# Patient Record
Sex: Female | Born: 2000 | Race: Black or African American | Hispanic: No | Marital: Single | Smoking: Never smoker
Health system: Southern US, Community
[De-identification: ages and names within clinical notes are randomized; demographics above are authoritative.]

---
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
## 2021-01-11 ENCOUNTER — Other Ambulatory Visit: Payer: Self-pay | Admitting: *Deleted

## 2021-01-11 DIAGNOSIS — N6320 Unspecified lump in the left breast, unspecified quadrant: Secondary | ICD-10-CM

## 2021-02-22 ENCOUNTER — Ambulatory Visit
Admission: RE | Admit: 2021-02-22 | Discharge: 2021-02-22 | Disposition: A | Discharge status: Home / Self Care | Payer: 59 | Source: Ambulatory Visit | Attending: *Deleted | Admitting: *Deleted

## 2021-02-22 ENCOUNTER — Other Ambulatory Visit: Payer: Self-pay

## 2021-02-22 DIAGNOSIS — N6320 Unspecified lump in the left breast, unspecified quadrant: Secondary | ICD-10-CM

## 2021-05-10 ENCOUNTER — Emergency Department (HOSPITAL_COMMUNITY)
Admission: EM | Admit: 2021-05-10 | Discharge: 2021-05-11 | Disposition: A | Discharge status: Home / Self Care | Payer: 59 | Attending: Emergency Medicine | Admitting: Emergency Medicine

## 2021-05-10 ENCOUNTER — Encounter (HOSPITAL_COMMUNITY): Payer: Self-pay

## 2021-05-10 ENCOUNTER — Emergency Department (HOSPITAL_COMMUNITY): Payer: 59

## 2021-05-10 ENCOUNTER — Other Ambulatory Visit: Payer: Self-pay

## 2021-05-10 DIAGNOSIS — Y92410 Unspecified street and highway as the place of occurrence of the external cause: Secondary | ICD-10-CM | POA: Insufficient documentation

## 2021-05-10 DIAGNOSIS — M542 Cervicalgia: Secondary | ICD-10-CM | POA: Diagnosis not present

## 2021-05-10 DIAGNOSIS — Z5321 Procedure and treatment not carried out due to patient leaving prior to being seen by health care provider: Secondary | ICD-10-CM | POA: Diagnosis not present

## 2021-05-10 DIAGNOSIS — M546 Pain in thoracic spine: Secondary | ICD-10-CM | POA: Insufficient documentation

## 2021-05-10 DIAGNOSIS — M545 Low back pain, unspecified: Secondary | ICD-10-CM | POA: Diagnosis present
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
## 2021-05-11 NOTE — ED Notes (Signed)
Patient left on own accord °

## 2021-11-20 ENCOUNTER — Emergency Department (HOSPITAL_COMMUNITY)
Admission: EM | Admit: 2021-11-20 | Discharge: 2021-11-21 | Disposition: A | Discharge status: Home / Self Care | Payer: 59 | Attending: Emergency Medicine | Admitting: Emergency Medicine

## 2021-11-20 ENCOUNTER — Other Ambulatory Visit: Payer: Self-pay

## 2021-11-20 ENCOUNTER — Encounter (HOSPITAL_COMMUNITY): Payer: Self-pay

## 2021-11-20 DIAGNOSIS — G510 Bell's palsy: Secondary | ICD-10-CM | POA: Insufficient documentation

## 2021-11-20 DIAGNOSIS — R29810 Facial weakness: Secondary | ICD-10-CM | POA: Diagnosis present

## 2021-11-20 LAB — COMPREHENSIVE METABOLIC PANEL
ALT: 15 U/L (ref 0–44)
AST: 18 U/L (ref 15–41)
Albumin: 3.6 g/dL (ref 3.5–5.0)
Alkaline Phosphatase: 21 U/L — ABNORMAL LOW (ref 38–126)
Anion gap: 11 (ref 5–15)
BUN: 12 mg/dL (ref 6–20)
CO2: 23 mmol/L (ref 22–32)
Calcium: 9.3 mg/dL (ref 8.9–10.3)
Chloride: 104 mmol/L (ref 98–111)
Creatinine, Ser: 0.8 mg/dL (ref 0.44–1.00)
GFR, Estimated: >60 mL/min (ref >60)
Glucose, Bld: 88 mg/dL (ref 70–99)
Potassium: 4.1 mmol/L (ref 3.5–5.1)
Sodium: 138 mmol/L (ref 135–145)
Total Bilirubin: 1.1 mg/dL (ref 0.3–1.2)
Total Protein: 7.4 g/dL (ref 6.5–8.1)

## 2021-11-20 LAB — CBC
HCT: 36.5 % (ref 36.0–46.0)
Hemoglobin: 12.5 g/dL (ref 12.0–15.0)
MCH: 31.4 pg (ref 26.0–34.0)
MCHC: 34.2 g/dL (ref 30.0–36.0)
MCV: 91.7 fL (ref 80.0–100.0)
Platelets: 267 10*3/uL (ref 150–400)
RBC: 3.98 MIL/uL (ref 3.87–5.11)
RDW: 12.1 % (ref 11.5–15.5)
WBC: 4.6 10*3/uL (ref 4.0–10.5)
nRBC: 0 % (ref 0.0–0.2)

## 2021-11-20 LAB — C-REACTIVE PROTEIN: CRP: 0.5 mg/dL (ref <1.0)

## 2021-11-20 LAB — SEDIMENTATION RATE: Sed Rate: 6 mm/hr (ref 0–22)

## 2021-11-20 NOTE — ED Triage Notes (Signed)
Pt was taking pictures today and noticed that left side of moouth was not moving. Pt has no other deficits. No numbness or tingling.LSW at 2200 last night. Denies headache

## 2021-11-20 NOTE — ED Provider Triage Note (Signed)
Emergency Medicine Provider Triage Evaluation Note  Allison Herrera , a 21 y.o. female  was evaluated in triage.  Pt complains of left-sided facial weakness which began this morning.  She awakened with the complaint.  Her last known well was 10 PM last night.  The patient has no headache or other complaints.  She has obvious weakness on the left side of her face including weakness to the forehead muscles on the left side of her face.  Sensation is equal bilaterally.  Review of Systems  Positive: Left-sided facial paralysis/weakness Negative: Headache  Physical Exam  BP (!) 142/86 (BP Location: Right Arm)   Pulse 76   Temp 99.2 F (37.3 C) (Oral)   Resp 18   Ht 5\' 3"  (1.6 m)   Wt 77.1 kg   LMP 11/05/2021   SpO2 99%   BMI 30.11 kg/m  Gen:   Awake, no distress   Resp:  Normal effort  MSK:   Moves extremities without difficulty  Other:    Medical Decision Making  Medically screening exam initiated at 8:46 PM.  Appropriate orders placed.  Allison Herrera was informed that the remainder of the evaluation will be completed by another provider, this initial triage assessment does not replace that evaluation, and the importance of remaining in the ED until their evaluation is complete.  Presentation at this time consistent with Bell's palsy, no code stroke at this time   Allison Herrera 11/20/21 2047

## 2021-11-21 IMAGING — US US BREAST*L* LIMITED INC AXILLA
1 series · 6 of 6 positions shown · non-contrast
Comparison: Previous exam(s).
COMPARISON: Previous exam(s).

Addendum:
CLINICAL DATA: Patient presents for evaluation of LEFT breast mass.
Patient had ultrasound-guided core biopsy of mass in the 8 o'clock
location of the LEFT breast performed 08/15/2018 showing benign
concordant fibroadenoma. The patient feels the mass has gotten
larger.

EXAM:
ULTRASOUND OF THE LEFT BREAST

[Series 1: us breast*left* limited inc axilla · 0.07mm/px · 6 of 6 slices shown]
[im 1/6]
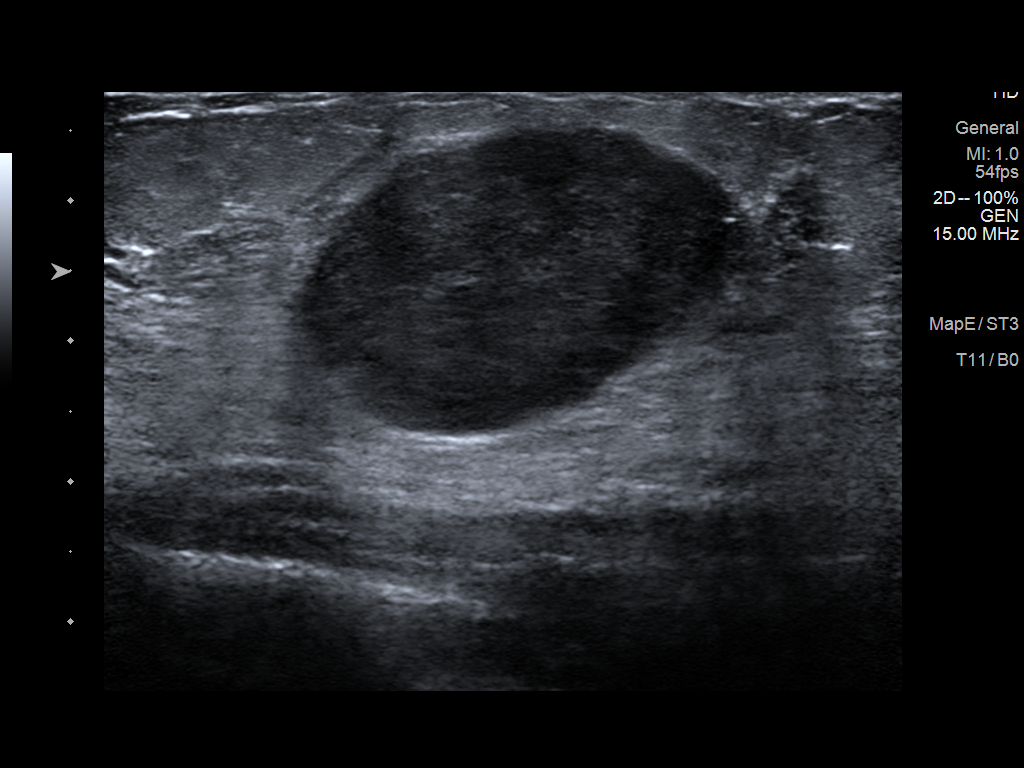
[im 2/6]
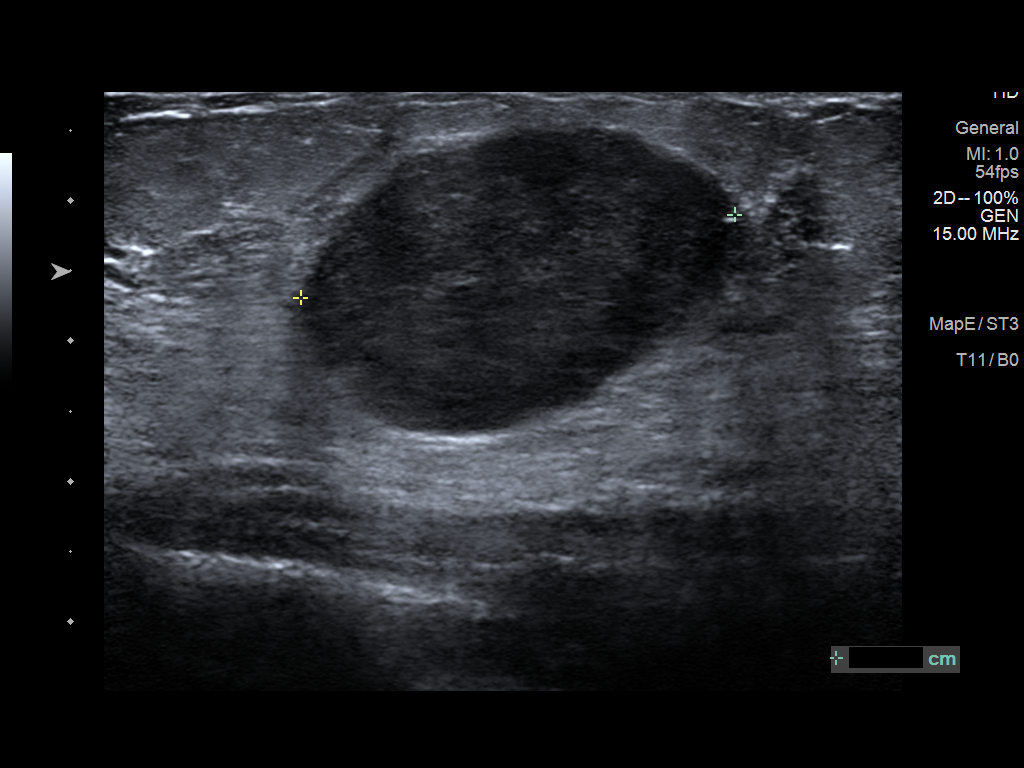
[im 3/6]
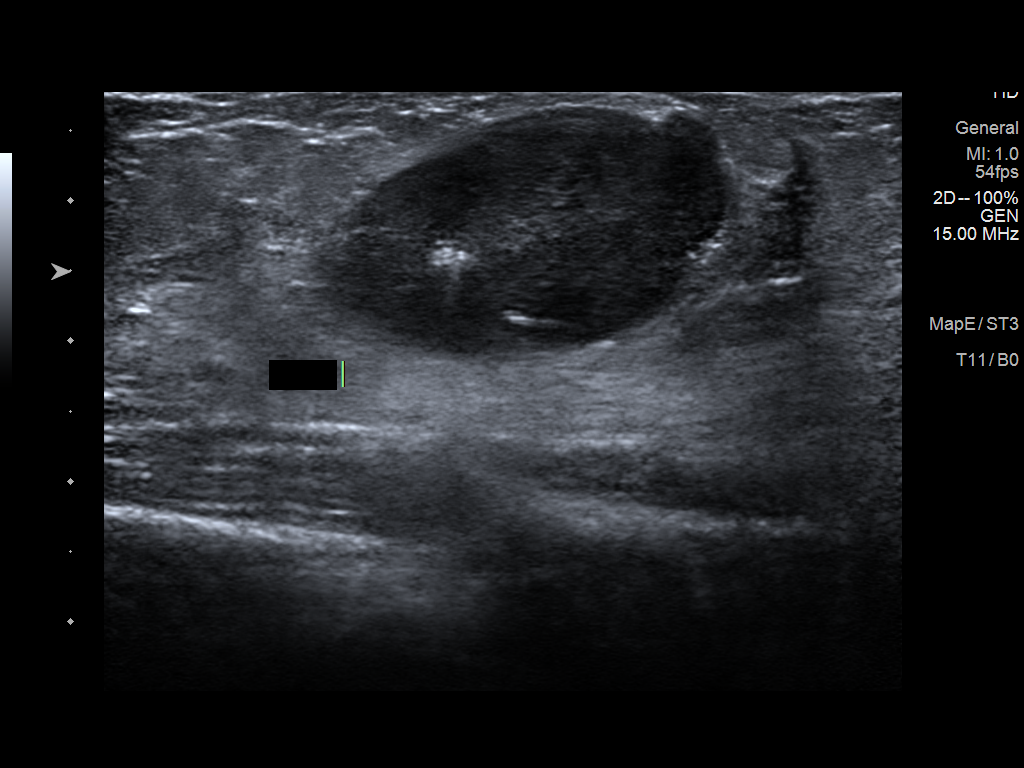
[im 4/6]
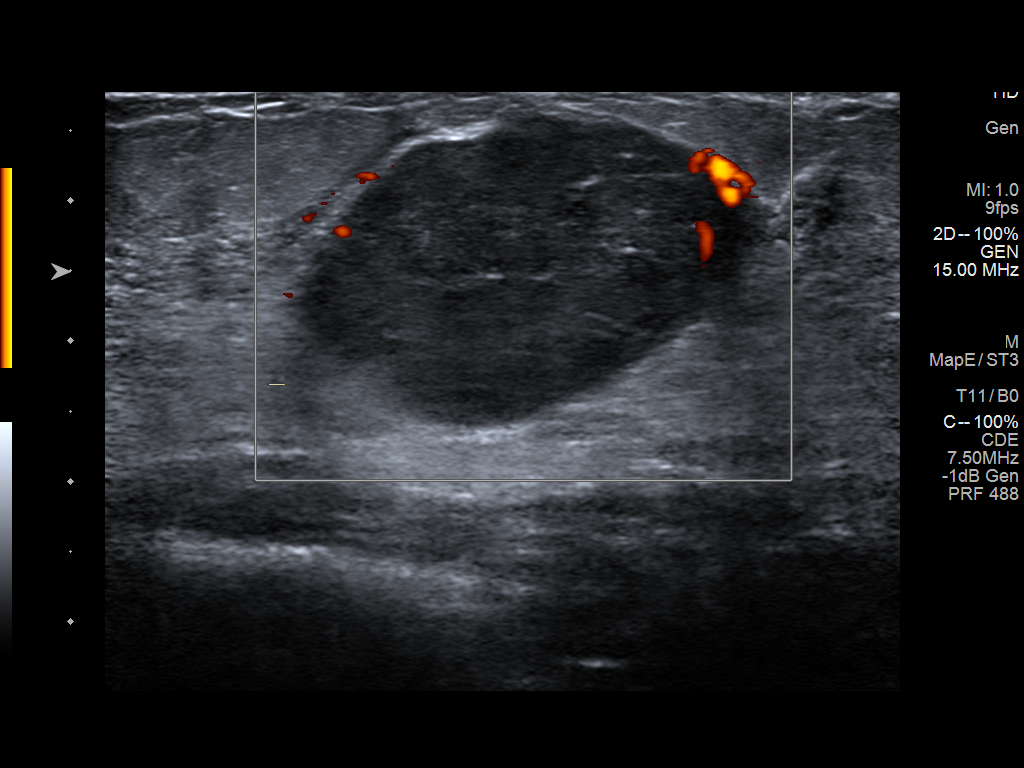
[im 5/6]
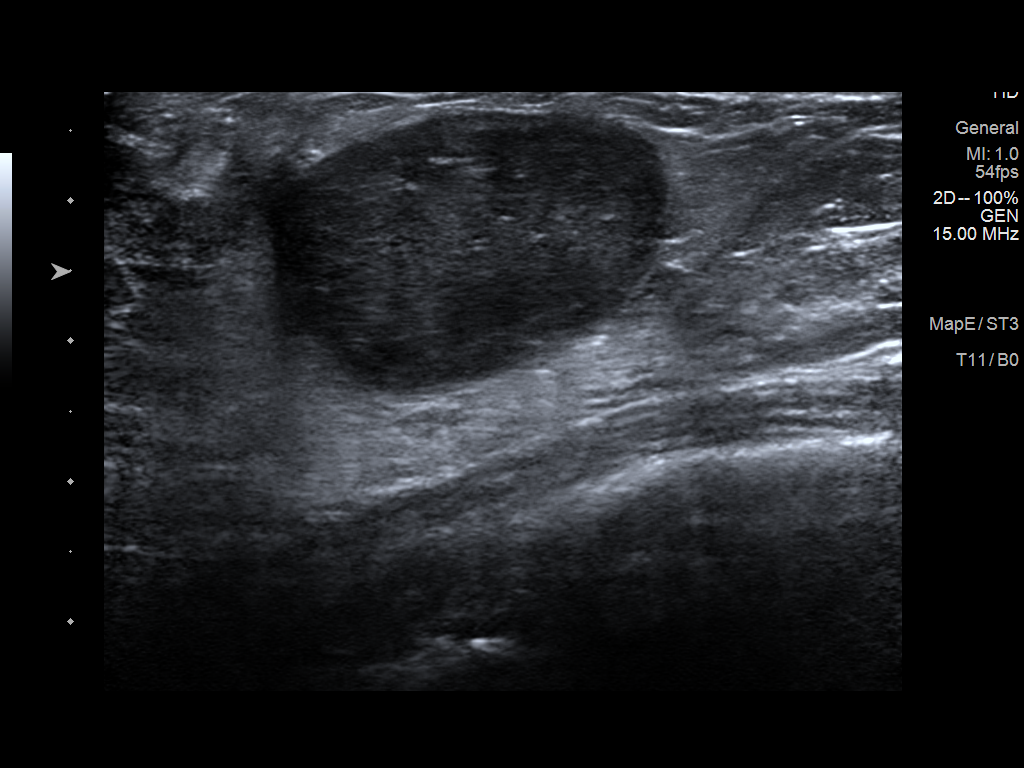
[im 6/6]
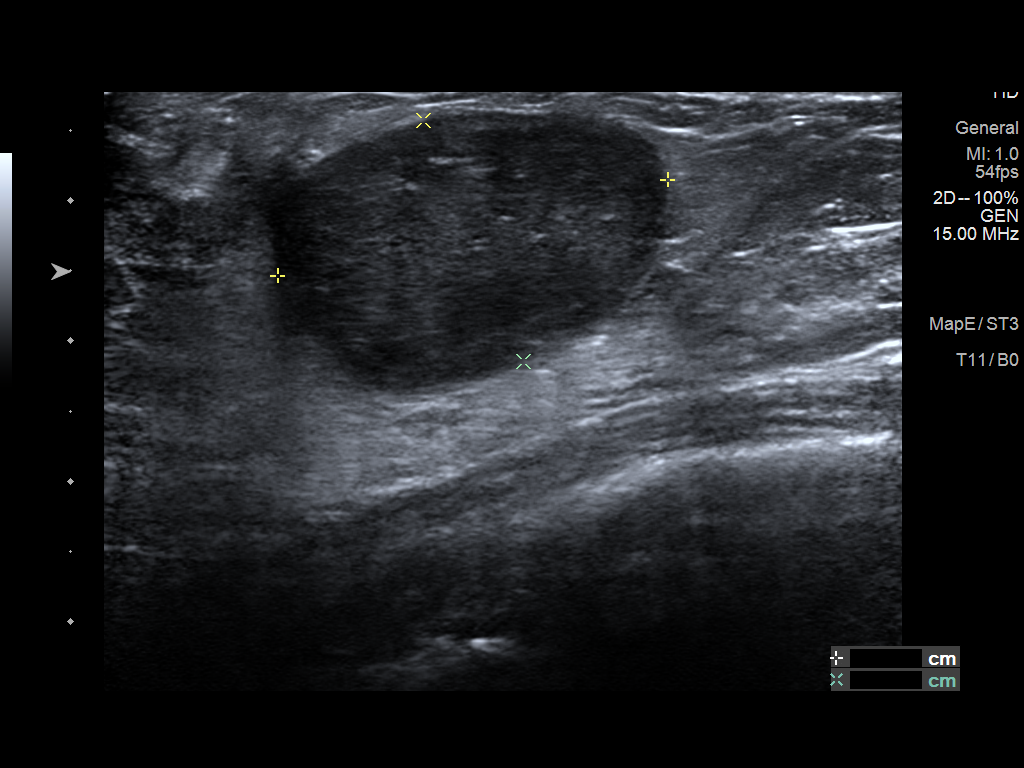

[6 of 6 positions shown; findings below may reference images not displayed]

FINDINGS: On physical exam, I palpate an oval mobile smooth mass in the 8
o'clock location of the LEFT breast.

Targeted ultrasound is performed, showing a circumscribed oval
hypoechoic parallel mass with posterior acoustic enhancement in the
8 o'clock location of the LEFT breast 2 centimeters from the nipple.
Artifact from tissue marker clip is identified within the mass. Mass
measures 3.2 x 2.9 x 1.9 centimeters. Previously, mass measured
x 1.8 x 2.5 centimeters.
IMPRESSION: Minimally larger biopsy-proven fibroadenoma in the LEFT breast,
consistent with benign process. The patient is interested in
surgical consultation to discuss possible excision.

RECOMMENDATION:
Recommend surgical consultation.

I have discussed the findings and recommendations with the patient.
If applicable, a reminder letter will be sent to the patient
regarding the next appointment.

BI-RADS CATEGORY  2: Benign.

ADDENDUM:
Surgical consultation has been arranged with Dr. Quality Neumann
at [REDACTED] on April 18, 2021.

Allbano Sasoma RN on 02/28/2021

*** End of Addendum ***
FINDINGS: On physical exam, I palpate an oval mobile smooth mass in the 8
o'clock location of the LEFT breast.

Targeted ultrasound is performed, showing a circumscribed oval
hypoechoic parallel mass with posterior acoustic enhancement in the
8 o'clock location of the LEFT breast 2 centimeters from the nipple.
Artifact from tissue marker clip is identified within the mass. Mass
measures 3.2 x 2.9 x 1.9 centimeters. Previously, mass measured
x 1.8 x 2.5 centimeters.
IMPRESSION: Minimally larger biopsy-proven fibroadenoma in the LEFT breast,
consistent with benign process. The patient is interested in
surgical consultation to discuss possible excision.

RECOMMENDATION:
Recommend surgical consultation.

I have discussed the findings and recommendations with the patient.
If applicable, a reminder letter will be sent to the patient
regarding the next appointment.

BI-RADS CATEGORY  2: Benign.

## 2021-11-21 MED ORDER — VALACYCLOVIR HCL 500 MG PO TABS
1000.0000 mg | ORAL_TABLET | Freq: Once | ORAL | Status: AC
  Administered 2021-11-21: 1000 mg via ORAL
  Filled 2021-11-21: qty 2

## 2021-11-21 MED ORDER — PREDNISONE 10 MG PO TABS: ORAL_TABLET | ORAL | 0 refills | Status: DC

## 2021-11-21 MED ORDER — VALACYCLOVIR HCL 1 G PO TABS: 1000.0000 mg | ORAL_TABLET | Freq: Three times a day (TID) | ORAL | 0 refills | Status: DC

## 2021-11-21 MED ORDER — VALACYCLOVIR HCL 1 G PO TABS: 1000.0000 mg | ORAL_TABLET | Freq: Three times a day (TID) | ORAL | 0 refills | Status: AC

## 2021-11-21 MED ORDER — PREDNISONE 20 MG PO TABS
60.0000 mg | ORAL_TABLET | Freq: Once | ORAL | Status: AC
Start: 2021-11-21 — End: 2021-11-21
  Administered 2021-11-21: 60 mg via ORAL
  Filled 2021-11-21: qty 3

## 2021-11-21 MED ORDER — ARTIFICIAL TEARS OPHTHALMIC OINT: TOPICAL_OINTMENT | Freq: Every day | OPHTHALMIC | 0 refills | Status: DC

## 2021-11-21 NOTE — Discharge Instructions (Addendum)
You were given your first dose of steroids and antiviral here in the emergency department.  Start the steroid (prednisone) tomorrow and start the antiviral Valtrex this afternoon.  Otherwise, follow-up with ear nose throat.  Return to the ER if you develop fever, vision changes, headache, new or worsening weakness or weakness in your arms or legs, trouble speaking, or any other new/concerning symptoms.

## 2021-11-21 NOTE — ED Provider Notes (Signed)
[facility] EMERGENCY DEPARTMENT Provider Note   CSN: [id number] Arrival date & time: 11/20/21  2013     History  Chief Complaint  Patient presents with   Facial Droop    Allison Herrera is a 20 y.o. female.  HPI 21 year old female with no significant past medical history presents with left-sided facial droop.  Started yesterday morning around 9, or at least that is when she noticed it when she tried to smile.  Her left face feels a little numb.  No headache, ear pain, or recent illness.  No extremity symptoms.  Feels like her left lip twitches every once in a while.  Denies any chance of being pregnant, LMP 7/23.  Home Medications Prior to Admission medications   Medication Sig Start Date End Date Taking? Authorizing Provider  artificial tears (LACRILUBE) OINT ophthalmic ointment Place into the left eye at bedtime. 11/21/21  Yes Pricilla Loveless, MD  predniSONE (DELTASONE) 10 MG tablet Take 60 mg daily x5 days.  Then take 50 mg x 1 day, followed by 40 mg 1 day, followed by 30 mg for 1 day, followed by 20 mg 1 day, followed by 10 mg 1 day, and then stop 11/21/21  Yes Pricilla Loveless, MD  valACYclovir (VALTREX) 1000 MG tablet Take 1 tablet (1,000 mg total) by mouth 3 (three) times daily for 7 days. 11/21/21 11/28/21 Yes Pricilla Loveless, MD  norgestimate-ethinyl estradiol (ORTHO-CYCLEN,SPRINTEC,PREVIFEM) 0.25-35 MG-MCG tablet Take 1 tablet by mouth daily.    [provider]      Allergies    Cefdinir, Cefuroxime, and Pineapple    Review of Systems   Review of Systems  Constitutional:  Negative for fever.  Neurological:  Positive for weakness and numbness. Negative for headaches.    Physical Exam Updated Vital Signs BP 118/78   Pulse 64   Temp 98.6 F (37 C)   Resp 17   Ht 5\' 3"  (1.6 m)   Wt 77.1 kg   LMP 11/05/2021   SpO2 98%   BMI 30.11 kg/m  Physical Exam Vitals and nursing note reviewed.  Constitutional:      Appearance: She is well-developed.   HENT:     Head: Normocephalic and atraumatic.     Right Ear: Tympanic membrane normal.     Left Ear: Tympanic membrane normal.  Eyes:     Extraocular Movements: Extraocular movements intact.     Pupils: Pupils are equal, round, and reactive to light.  Cardiovascular:     Rate and Rhythm: Normal rate and regular rhythm.     Heart sounds: Normal heart sounds.  Pulmonary:     Effort: Pulmonary effort is normal.     Breath sounds: Normal breath sounds.  Abdominal:     Palpations: Abdomen is soft.     Tenderness: There is no abdominal tenderness.  Skin:    General: Skin is warm and dry.  Neurological:     Mental Status: She is alert.     Comments: Left facial weakness, including forehead. Grossly normal sensation in left face compared to right. Clear speech. 5/5 strength in all 4 extremities. Grossly normal sensation in extremities.  Normal finger to nose.      ED Results / Procedures / Treatments   Labs (all labs ordered are listed, but only abnormal results are displayed) Labs Reviewed  COMPREHENSIVE METABOLIC PANEL - Abnormal; Notable for the following components:      Result Value   Alkaline Phosphatase 21 (*)    All  other components within normal limits  SEDIMENTATION RATE  C-REACTIVE PROTEIN  CBC    EKG None  Radiology No results found.  Procedures Procedures    Medications Ordered in ED Medications  predniSONE (DELTASONE) tablet 60 mg (has no administration in time range)  valACYclovir (VALTREX) tablet 1,000 mg (has no administration in time range)    ED Course/ Medical Decision Making/ A&P                           Medical Decision Making Risk Prescription drug management.   Presentation is consistent with Bell's palsy.  Labs have been done in triage and were reviewed/interpreted and are unremarkable including normal WBC and electrolytes.  Inflammatory markers were obtained in triage and were also negative.  No imaging is needed as this is consistent  with Bell's palsy.  She will be started on prednisone and valacyclovir.  No recent illness and my suspicion for something like Lyme disease is pretty low.  Will refer to ENT.  She otherwise appears well and was given return precautions.        Final Clinical Impression(s) / ED Diagnoses Final diagnoses:  Bell's palsy    Rx / DC Orders ED Discharge Orders          Ordered    valACYclovir (VALTREX) 1000 MG tablet  3 times daily        11/21/21 0757    artificial tears (LACRILUBE) OINT ophthalmic ointment  Daily at bedtime        11/21/21 0757    predniSONE (DELTASONE) 10 MG tablet        11/21/21 0757              Pricilla Loveless, MD 11/21/21 [phone]
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
## 2023-07-10 ENCOUNTER — Other Ambulatory Visit: Payer: Self-pay | Admitting: General Surgery

## 2023-07-17 ENCOUNTER — Encounter (HOSPITAL_BASED_OUTPATIENT_CLINIC_OR_DEPARTMENT_OTHER): Payer: Self-pay | Admitting: General Surgery

## 2023-07-24 ENCOUNTER — Other Ambulatory Visit: Payer: Self-pay

## 2023-07-24 ENCOUNTER — Ambulatory Visit (HOSPITAL_BASED_OUTPATIENT_CLINIC_OR_DEPARTMENT_OTHER): Admitting: Anesthesiology

## 2023-07-24 ENCOUNTER — Ambulatory Visit (HOSPITAL_BASED_OUTPATIENT_CLINIC_OR_DEPARTMENT_OTHER)
Admission: RE | Admit: 2023-07-24 | Discharge: 2023-07-24 | Disposition: A | Discharge status: Home / Self Care | Attending: General Surgery | Admitting: General Surgery

## 2023-07-24 ENCOUNTER — Encounter (HOSPITAL_BASED_OUTPATIENT_CLINIC_OR_DEPARTMENT_OTHER)
Admission: RE | Disposition: A | Discharge status: Home / Self Care | Payer: Self-pay | Source: Home / Self Care | Attending: General Surgery

## 2023-07-24 ENCOUNTER — Encounter (HOSPITAL_BASED_OUTPATIENT_CLINIC_OR_DEPARTMENT_OTHER): Payer: Self-pay | Admitting: General Surgery

## 2023-07-24 DIAGNOSIS — Z01818 Encounter for other preprocedural examination: Secondary | ICD-10-CM

## 2023-07-24 DIAGNOSIS — N6320 Unspecified lump in the left breast, unspecified quadrant: Secondary | ICD-10-CM | POA: Insufficient documentation

## 2023-07-24 HISTORY — PX: BREAST CYST EXCISION: SHX579

## 2023-07-24 LAB — POCT PREGNANCY, URINE: Preg Test, Ur: NEGATIVE

## 2023-07-24 SURGERY — EXCISION, CYST, BREAST
Anesthesia: General | Site: Breast | Laterality: Left

## 2023-07-24 MED ORDER — PROPOFOL 10 MG/ML IV BOLUS
INTRAVENOUS | Status: DC | PRN
  Administered 2023-07-24: 200 mg via INTRAVENOUS

## 2023-07-24 MED ORDER — BUPIVACAINE HCL (PF) 0.25 % IJ SOLN
INTRAMUSCULAR | Status: DC | PRN
  Administered 2023-07-24: 10 mL

## 2023-07-24 MED ORDER — LIDOCAINE 2% (20 MG/ML) 5 ML SYRINGE
INTRAMUSCULAR | Status: AC
  Filled 2023-07-24: qty 5

## 2023-07-24 MED ORDER — MIDAZOLAM HCL 5 MG/5ML IJ SOLN
INTRAMUSCULAR | Status: DC | PRN
  Administered 2023-07-24: 2 mg via INTRAVENOUS

## 2023-07-24 MED ORDER — OXYCODONE HCL 5 MG PO TABS: 5.0000 mg | ORAL_TABLET | Freq: Once | ORAL | Status: DC | PRN

## 2023-07-24 MED ORDER — 0.9 % SODIUM CHLORIDE (POUR BTL) OPTIME
TOPICAL | Status: DC | PRN
  Administered 2023-07-24: 500 mL

## 2023-07-24 MED ORDER — OXYCODONE HCL 5 MG/5ML PO SOLN: 5.0000 mg | Freq: Once | ORAL | Status: DC | PRN

## 2023-07-24 MED ORDER — CHLORHEXIDINE GLUCONATE CLOTH 2 % EX PADS: 6.0000 | MEDICATED_PAD | Freq: Once | CUTANEOUS | Status: DC

## 2023-07-24 MED ORDER — KETOROLAC TROMETHAMINE 15 MG/ML IJ SOLN
INTRAMUSCULAR | Status: AC
  Filled 2023-07-24: qty 1

## 2023-07-24 MED ORDER — LACTATED RINGERS IV SOLN: INTRAVENOUS | Status: DC

## 2023-07-24 MED ORDER — ONDANSETRON HCL 4 MG/2ML IJ SOLN
INTRAMUSCULAR | Status: DC | PRN
  Administered 2023-07-24: 4 mg via INTRAVENOUS

## 2023-07-24 MED ORDER — FENTANYL CITRATE (PF) 100 MCG/2ML IJ SOLN: 25.0000 ug | INTRAMUSCULAR | Status: DC | PRN

## 2023-07-24 MED ORDER — DEXAMETHASONE SODIUM PHOSPHATE 10 MG/ML IJ SOLN
INTRAMUSCULAR | Status: DC | PRN
  Administered 2023-07-24: 10 mg via INTRAVENOUS

## 2023-07-24 MED ORDER — ENSURE PRE-SURGERY PO LIQD: 296.0000 mL | Freq: Once | ORAL | Status: DC

## 2023-07-24 MED ORDER — MIDAZOLAM HCL 2 MG/2ML IJ SOLN
INTRAMUSCULAR | Status: AC
  Filled 2023-07-24: qty 2

## 2023-07-24 MED ORDER — ACETAMINOPHEN 500 MG PO TABS
1000.0000 mg | ORAL_TABLET | Freq: Once | ORAL | Status: AC
  Administered 2023-07-24: 1000 mg via ORAL

## 2023-07-24 MED ORDER — KETOROLAC TROMETHAMINE 15 MG/ML IJ SOLN
15.0000 mg | INTRAMUSCULAR | Status: AC
  Administered 2023-07-24: 15 mg via INTRAVENOUS

## 2023-07-24 MED ORDER — ACETAMINOPHEN 500 MG PO TABS
ORAL_TABLET | ORAL | Status: AC
  Filled 2023-07-24: qty 2

## 2023-07-24 MED ORDER — LIDOCAINE 2% (20 MG/ML) 5 ML SYRINGE
INTRAMUSCULAR | Status: DC | PRN
  Administered 2023-07-24: 100 mg via INTRAVENOUS

## 2023-07-24 MED ORDER — ONDANSETRON HCL 4 MG/2ML IJ SOLN
INTRAMUSCULAR | Status: AC
  Filled 2023-07-24: qty 2

## 2023-07-24 MED ORDER — FENTANYL CITRATE (PF) 100 MCG/2ML IJ SOLN
INTRAMUSCULAR | Status: AC
  Filled 2023-07-24: qty 2

## 2023-07-24 MED ORDER — CIPROFLOXACIN IN D5W 400 MG/200ML IV SOLN
INTRAVENOUS | Status: AC
  Filled 2023-07-24: qty 200

## 2023-07-24 MED ORDER — BUPIVACAINE HCL (PF) 0.25 % IJ SOLN
INTRAMUSCULAR | Status: AC
  Filled 2023-07-24: qty 30

## 2023-07-24 MED ORDER — AMISULPRIDE (ANTIEMETIC) 5 MG/2ML IV SOLN: 10.0000 mg | Freq: Once | INTRAVENOUS | Status: DC | PRN

## 2023-07-24 MED ORDER — FENTANYL CITRATE (PF) 100 MCG/2ML IJ SOLN
INTRAMUSCULAR | Status: DC | PRN
Start: 2023-07-24 — End: 2023-07-24
  Administered 2023-07-24 (×2): 50 ug via INTRAVENOUS

## 2023-07-24 MED ORDER — CIPROFLOXACIN IN D5W 400 MG/200ML IV SOLN
400.0000 mg | INTRAVENOUS | Status: AC
  Administered 2023-07-24: 400 mg via INTRAVENOUS

## 2023-07-24 MED ORDER — DEXAMETHASONE SODIUM PHOSPHATE 10 MG/ML IJ SOLN
INTRAMUSCULAR | Status: AC
  Filled 2023-07-24: qty 1

## 2023-07-24 MED ORDER — ACETAMINOPHEN 500 MG PO TABS
1000.0000 mg | ORAL_TABLET | ORAL | Status: AC
Start: 2023-07-24 — End: 2023-07-24

## 2023-07-24 SURGICAL SUPPLY — 44 items
BINDER BREAST LRG (GAUZE/BANDAGES/DRESSINGS) IMPLANT
BINDER BREAST MEDIUM (GAUZE/BANDAGES/DRESSINGS) IMPLANT
BINDER BREAST XLRG (GAUZE/BANDAGES/DRESSINGS) IMPLANT
BINDER BREAST XXLRG (GAUZE/BANDAGES/DRESSINGS) IMPLANT
BLADE SURG 15 STRL LF DISP TIS (BLADE) ×1 IMPLANT
CANISTER SUCT 1200ML W/VALVE (MISCELLANEOUS) IMPLANT
CHLORAPREP W/TINT 26 (MISCELLANEOUS) ×1 IMPLANT
CLIP TI WIDE RED SMALL 6 (CLIP) IMPLANT
COVER BACK TABLE 60X90IN (DRAPES) ×1 IMPLANT
COVER MAYO STAND STRL (DRAPES) ×1 IMPLANT
DERMABOND ADVANCED .7 DNX12 (GAUZE/BANDAGES/DRESSINGS) IMPLANT
DRAPE LAPAROSCOPIC ABDOMINAL (DRAPES) ×1 IMPLANT
DRAPE UTILITY XL STRL (DRAPES) ×1 IMPLANT
DRSG TEGADERM 4X4.75 (GAUZE/BANDAGES/DRESSINGS) ×1 IMPLANT
ELECT COATED BLADE 2.86 ST (ELECTRODE) ×1 IMPLANT
ELECT REM PT RETURN 9FT ADLT (ELECTROSURGICAL) ×1 IMPLANT
ELECTRODE REM PT RTRN 9FT ADLT (ELECTROSURGICAL) ×1 IMPLANT
GAUZE SPONGE 4X4 12PLY STRL LF (GAUZE/BANDAGES/DRESSINGS) ×1 IMPLANT
GLOVE BIO SURGEON STRL SZ7 (GLOVE) ×1 IMPLANT
GLOVE BIOGEL PI IND STRL 7.5 (GLOVE) ×1 IMPLANT
GOWN STRL REUS W/ TWL LRG LVL3 (GOWN DISPOSABLE) ×3 IMPLANT
KIT MARKER MARGIN INK (KITS) ×1 IMPLANT
NDL HYPO 25X1 1.5 SAFETY (NEEDLE) ×1 IMPLANT
NEEDLE HYPO 25X1 1.5 SAFETY (NEEDLE) ×1 IMPLANT
NS IRRIG 1000ML POUR BTL (IV SOLUTION) IMPLANT
PACK BASIN DAY SURGERY FS (CUSTOM PROCEDURE TRAY) ×1 IMPLANT
PENCIL SMOKE EVACUATOR (MISCELLANEOUS) ×1 IMPLANT
RETRACTOR ONETRAX LX 90X20 (MISCELLANEOUS) IMPLANT
SLEEVE SCD COMPRESS KNEE MED (STOCKING) ×1 IMPLANT
SPIKE FLUID TRANSFER (MISCELLANEOUS) IMPLANT
SPONGE T-LAP 4X18 ~~LOC~~+RFID (SPONGE) ×1 IMPLANT
STRIP CLOSURE SKIN 1/2X4 (GAUZE/BANDAGES/DRESSINGS) ×1 IMPLANT
SUT MNCRL AB 4-0 PS2 18 (SUTURE) IMPLANT
SUT MON AB 5-0 PS2 18 (SUTURE) IMPLANT
SUT SILK 2 0 SH (SUTURE) ×1 IMPLANT
SUT VIC AB 2-0 SH 27XBRD (SUTURE) ×1 IMPLANT
SUT VIC AB 3-0 SH 27X BRD (SUTURE) ×1 IMPLANT
SUT VIC AB 5-0 PS2 18 (SUTURE) IMPLANT
SUT VICRYL AB 3 0 TIES (SUTURE) IMPLANT
SYR CONTROL 10ML LL (SYRINGE) ×1 IMPLANT
TOWEL GREEN STERILE FF (TOWEL DISPOSABLE) ×1 IMPLANT
TRAY FAXITRON CT DISP (TRAY / TRAY PROCEDURE) IMPLANT
TUBE CONNECTING 20X1/4 (TUBING) IMPLANT
YANKAUER SUCT BULB TIP NO VENT (SUCTIONS) IMPLANT

## 2023-07-24 NOTE — Anesthesia Preprocedure Evaluation (Addendum)
Anesthesia Evaluation  Patient identified by MRN, date of birth, ID band Patient awake    Reviewed: Allergy & Precautions, NPO status , Patient's Chart, lab work & pertinent test results  Airway Mallampati: II  TM Distance: >3 FB Neck ROM: Full    Dental no notable dental hx.    Pulmonary neg pulmonary ROS   Pulmonary exam normal        Cardiovascular negative cardio ROS Normal cardiovascular exam     Neuro/Psych negative neurological ROS  negative psych ROS   GI/Hepatic negative GI ROS, Neg liver ROS,,,  Endo/Other  negative endocrine ROS    Renal/GU negative Renal ROS     Musculoskeletal negative musculoskeletal ROS (+)    Abdominal  (+) + obese  Peds  Hematology negative hematology ROS (+)   Anesthesia Other Findings LEFT BREAST MASS  Reproductive/Obstetrics Hcg negative                             Anesthesia Physical Anesthesia Plan  ASA: 1  Anesthesia Plan: General   Post-op Pain Management:    Induction: Intravenous  PONV Risk Score and Plan: 3 and Ondansetron, Dexamethasone, Midazolam and Treatment may vary due to age or medical condition  Airway Management Planned: LMA  Additional Equipment:   Intra-op Plan:   Post-operative Plan: Extubation in OR  Informed Consent: I have reviewed the patients History and Physical, chart, labs and discussed the procedure including the risks, benefits and alternatives for the proposed anesthesia with the patient or authorized representative who has indicated his/her understanding and acceptance.     Dental advisory given  Plan Discussed with: CRNA  Anesthesia Plan Comments:        Anesthesia Quick Evaluation

## 2023-07-24 NOTE — Op Note (Signed)
 Preoperative diagnosis: Left breast mass Postoperative diagnosis: Same as above Procedure: Left breast mass excisional biopsy Surgeon: Dr. Harden Mo Anesthesia: General Complications: None Drains: None Specimens: Left breast mass to pathology marked short superior, long lateral, double deep Sponge needle count was correct at completion Disposition to recovery stable condition  Indications: This is a 23 year old female I saw initially in 2023.  She has had a left breast mass since 2019.  Ultrasound had showed this area increased to 3.2 cm and she did have a biopsy previously confirmed a fibroadenoma.  She desired excision.  Procedure: After informed consent was obtained she was taken to the operating room.  I infiltrated Marcaine throughout this area.  I made a periareolar incision in order to hide the scar later.  I then dissected to what appeared to clinically be a fibroadenoma.  I remove the fibroadenoma in its entirety.  I then obtained hemostasis.  I then closed the cavity with 2-0 Vicryl.  The skin was closed with 3-0 Vicryl and 5-0 Monocryl.  Glue and a Steri-Strip were placed.  She tolerated this well and was transferred to recovery stable.

## 2023-07-24 NOTE — H&P (Signed)
  22 yof I saw previously in 2023. She had left breast mass since 2019. She had [place] then that showed 2.8x2.2 cm likely FA. She had six month follow up that showed this was minimally increased in size. Biopsy confirmed FA. Thought it got bigger and had another [place] then that showed it had increased to 3.2. cm. We discussed excision but never scheduled. She returns today. This area is still same. Desires excision.   Review of Systems: A complete review of systems was obtained from the patient. I have reviewed this information and discussed as appropriate with the patient. See HPI as well for other ROS.  Review of Systems  All other systems reviewed and are negative.   Medical History: History reviewed. No pertinent past medical history.  There is no problem list on file for this patient.  Past Surgical History:  Procedure Laterality Date  belly button removed    Allergies  Allergen Reactions  Omnicef [Cefdinir] Swelling   Current Outpatient Medications on File Prior to Visit  Medication Sig Dispense Refill  norgestimate-ethinyl estradioL (SPRINTEC 0.25/35, 28,) 0.25-35 mg-mcg tablet Take 1 tablet by mouth once daily   No current facility-administered medications on file prior to visit.   Family History  Problem Relation Age of Onset  High blood pressure (Hypertension) Mother  High blood pressure (Hypertension) Father    Social History   Tobacco Use  Smoking Status Never  Smokeless Tobacco Former    Social History   Socioeconomic History  Marital status: Single  Tobacco Use  Smoking status: Never  Smokeless tobacco: Former  Substance and Sexual Activity  Alcohol use: Never  Drug use: Never   Social Drivers of Health   Housing Stability: Unknown (07/10/2023)  Housing Stability Vital Sign  Homeless in the Last Year: No   Objective:   There were no vitals filed for this visit.  There is no height or weight on file to calculate BMI.  Physical Exam Vitals  reviewed.  Constitutional:  Appearance: Normal appearance.  Chest:  Breasts: Left: Mass present. No inverted nipple or nipple discharge.   Comments: 3 cm liq mobile mass  Lymphadenopathy:  Upper Body:  Left upper body: No axillary adenopathy.  Neurological:  Mental Status: She is alert.     Assessment and Plan:   Left breast mass Left mass excision  This area certainly at least still the same as when I last saw her. She desires excision and I agree. We discussed excision with the periareolar incision as well as the postop recovery and risks associated with surgery. Will plan to proceed.

## 2023-07-24 NOTE — Anesthesia Procedure Notes (Signed)
 Procedure Name: LMA Insertion Date/Time: 07/24/2023 1:28 PM  Performed by: Burna Cash, CRNAPre-anesthesia Checklist: Patient identified, Emergency Drugs available, Suction available and Patient being monitored Patient Re-evaluated:Patient Re-evaluated prior to induction Oxygen Delivery Method: Circle system utilized Preoxygenation: Pre-oxygenation with 100% oxygen Induction Type: IV induction Ventilation: Mask ventilation without difficulty LMA: LMA inserted LMA Size: 4.0 Number of attempts: 1 Airway Equipment and Method: Bite block Placement Confirmation: positive ETCO2 Tube secured with: Tape Dental Injury: Teeth and Oropharynx as per pre-operative assessment

## 2023-07-24 NOTE — Interval H&P Note (Signed)
 History and Physical Interval Note:  07/24/2023 12:46 PM  Allison Herrera  has presented today for surgery, with the diagnosis of LEFT BREAST MASS.  The various methods of treatment have been discussed with the patient and family. After consideration of risks, benefits and other options for treatment, the patient has consented to  Procedure(s) with comments: EXCISION, CYST, BREAST (Left) - LEFT BREAST MASS EXCISION LMA as a surgical intervention.  The patient's history has been reviewed, patient examined, no change in status, stable for surgery.  I have reviewed the patient's chart and labs.  Questions were answered to the patient's satisfaction.     Emelia Loron

## 2023-07-24 NOTE — Discharge Instructions (Addendum)
 [facility] Office Phone Number [phone]  POST OP INSTRUCTIONS Take 400 mg of ibuprofen every 8 hours or 650 mg tylenol every 6 hours for next 72 hours then as needed. Use ice several times daily also.  A prescription for pain medication may be given to you upon discharge.  Take your pain medication as prescribed, if needed.  If narcotic pain medicine is not needed, then you may take acetaminophen (Tylenol), naprosyn (Alleve) or ibuprofen (Advil) as needed. Take your usually prescribed medications unless otherwise directed If you need a refill on your pain medication, please contact your pharmacy.  They will contact our office to request authorization.  Prescriptions will not be filled after 5pm or on week-ends. You should eat very light the first 24 hours after surgery, such as soup, crackers, pudding, etc.  Resume your normal diet the day after surgery. Most patients will experience some swelling and bruising in the breast.  Ice packs and a good support bra will help.  Wear the breast binder provided or a sports bra for 72 hours day and night.  After that wear a sports bra during the day until you return to the office. Swelling and bruising can take several days to resolve.  It is common to experience some constipation if taking pain medication after surgery.  Increasing fluid intake and taking a stool softener will usually help or prevent this problem from occurring.  A mild laxative (Milk of Magnesia or Miralax) should be taken according to package directions if there are no bowel movements after 48 hours. I used skin glue on the incision, you may shower in 24 hours.  The glue will flake off over the next 2-3 weeks.  Any sutures or staples will be removed at the office during your follow-up visit. ACTIVITIES:  You may resume regular daily activities (gradually increasing) beginning the next day.  Wearing a good support bra or sports bra minimizes pain and swelling.  You may have  sexual intercourse when it is comfortable. You may drive when you no longer are taking prescription pain medication, you can comfortably wear a seatbelt, and you can safely maneuver your car and apply brakes. RETURN TO WORK:  ______________________________________________________________________________________ Allison Herrera should see your doctor in the office for a follow-up appointment approximately two weeks after your surgery.  Your doctor's nurse will typically make your follow-up appointment when she calls you with your pathology report.  Expect your pathology report 3-4 business days after your surgery.  You may call to check if you do not hear from [place] after three days. OTHER INSTRUCTIONS: _______________________________________________________________________________________________ _____________________________________________________________________________________________________________________________________ _____________________________________________________________________________________________________________________________________ _____________________________________________________________________________________________________________________________________  WHEN TO CALL DR WAKEFIELD: Fever over 101.0 Nausea and/or vomiting. Extreme swelling or bruising. Continued bleeding from incision. Increased pain, redness, or drainage from the incision.  The clinic staff is available to answer your questions during regular business hours.  Please don't hesitate to call and ask to speak to one of the nurses for clinical concerns.  If you have a medical emergency, go to the nearest emergency room or call 911.  A surgeon from [facility] is always on call at the hospital.  For further questions, please visit [email or web address]    Post Anesthesia Home Care Instructions  Activity: Get plenty of rest for the remainder of the day. A responsible individual must stay  with you for 24 hours following the procedure.  For the next 24 hours, DO NOT: -Drive a car -Advertising copywriter -Drink alcoholic beverages -Take any medication unless instructed  by your physician -Make any legal decisions or sign important papers.  Meals: Start with liquid foods such as gelatin or soup. Progress to regular foods as tolerated. Avoid greasy, spicy, heavy foods. If nausea and/or vomiting occur, drink only clear liquids until the nausea and/or vomiting subsides. Call your physician if vomiting continues.  Special Instructions/Symptoms: Your throat may feel dry or sore from the anesthesia or the breathing tube placed in your throat during surgery. If this causes discomfort, gargle with warm salt water. The discomfort should disappear within 24 hours.  If you had a scopolamine patch placed behind your ear for the management of post- operative nausea and/or vomiting:  1. The medication in the patch is effective for 72 hours, after which it should be removed.  Wrap patch in a tissue and discard in the trash. Wash hands thoroughly with soap and water. 2. You may remove the patch earlier than 72 hours if you experience unpleasant side effects which may include dry mouth, dizziness or visual disturbances. 3. Avoid touching the patch. Wash your hands with soap and water after contact with the patch.     Next dose of tylenol if needed is at 5:17pm

## 2023-07-24 NOTE — Transfer of Care (Signed)
 Immediate Anesthesia Transfer of Care Note  Patient: JAZARA SWINEY  Procedure(s) Performed: EXCISION, CYST, BREAST (Left: Breast)  Patient Location: PACU  Anesthesia Type:General  Level of Consciousness: sedated  Airway & Oxygen Therapy: Patient Spontanous Breathing and Patient connected to face mask oxygen  Post-op Assessment: Report given to RN and Post -op Vital signs reviewed and stable  Post vital signs: Reviewed and stable  Last Vitals:  Vitals Value Taken Time  BP 117/63 07/24/23 1403  Temp    Pulse 73 07/24/23 1405  Resp 15 07/24/23 1405  SpO2 100 % 07/24/23 1405  Vitals shown include unfiled device data.  Last Pain:  Vitals:   07/24/23 1113  TempSrc: Temporal  PainSc: 0-No pain      Patients Stated Pain Goal: 2 (07/24/23 1113)  Complications: No notable events documented.

## 2023-07-25 ENCOUNTER — Encounter (HOSPITAL_BASED_OUTPATIENT_CLINIC_OR_DEPARTMENT_OTHER): Payer: Self-pay | Admitting: General Surgery

## 2023-07-25 NOTE — Anesthesia Postprocedure Evaluation (Signed)
 Anesthesia Post Note  Patient: Allison Herrera  Procedure(s) Performed: EXCISION, CYST, BREAST (Left: Breast)     Patient location during evaluation: PACU Anesthesia Type: General Level of consciousness: awake Pain management: pain level controlled Vital Signs Assessment: post-procedure vital signs reviewed and stable Respiratory status: spontaneous breathing, nonlabored ventilation and respiratory function stable Cardiovascular status: blood pressure returned to baseline and stable Postop Assessment: no apparent nausea or vomiting Anesthetic complications: no   No notable events documented.  Last Vitals:  Vitals:   07/24/23 1415 07/24/23 1445  BP: 119/69 131/74  Pulse: 70 74  Resp: 14 18  Temp:  (!) 36.3 C  SpO2: 100% 99%    Last Pain:  Vitals:   07/24/23 1445  TempSrc: Temporal  PainSc:                  Catheryn Bacon Jarin Cornfield

## 2023-07-26 LAB — SURGICAL PATHOLOGY
